# Patient Record
Sex: Male | Born: 1975 | Race: Asian | Hispanic: No | Marital: Married
Health system: Southern US, Community
[De-identification: ages and names within clinical notes are randomized; demographics above are authoritative.]

---

## 2016-09-28 DIAGNOSIS — Z0000 Encounter for general adult medical examination without abnormal findings: Secondary | ICD-10-CM | POA: Diagnosis not present

## 2016-09-28 DIAGNOSIS — Z13220 Encounter for screening for lipoid disorders: Secondary | ICD-10-CM | POA: Diagnosis not present

## 2017-11-04 DIAGNOSIS — Z0000 Encounter for general adult medical examination without abnormal findings: Secondary | ICD-10-CM | POA: Diagnosis not present

## 2017-11-04 DIAGNOSIS — M3501 Sicca syndrome with keratoconjunctivitis: Secondary | ICD-10-CM | POA: Diagnosis not present

## 2019-01-09 DIAGNOSIS — M3501 Sicca syndrome with keratoconjunctivitis: Secondary | ICD-10-CM | POA: Diagnosis not present

## 2019-01-09 DIAGNOSIS — Z0000 Encounter for general adult medical examination without abnormal findings: Secondary | ICD-10-CM | POA: Diagnosis not present

## 2019-01-09 DIAGNOSIS — E559 Vitamin D deficiency, unspecified: Secondary | ICD-10-CM | POA: Diagnosis not present

## 2019-01-12 DIAGNOSIS — Z0000 Encounter for general adult medical examination without abnormal findings: Secondary | ICD-10-CM | POA: Diagnosis not present

## 2019-06-07 DIAGNOSIS — R519 Headache, unspecified: Secondary | ICD-10-CM | POA: Diagnosis not present

## 2019-06-07 DIAGNOSIS — R410 Disorientation, unspecified: Secondary | ICD-10-CM | POA: Diagnosis not present

## 2019-06-15 DIAGNOSIS — H524 Presbyopia: Secondary | ICD-10-CM | POA: Diagnosis not present

## 2019-06-15 DIAGNOSIS — H5319 Other subjective visual disturbances: Secondary | ICD-10-CM | POA: Diagnosis not present

## 2019-06-15 DIAGNOSIS — H31092 Other chorioretinal scars, left eye: Secondary | ICD-10-CM | POA: Diagnosis not present

## 2019-07-05 ENCOUNTER — Other Ambulatory Visit: Payer: Self-pay | Admitting: Family Medicine

## 2019-07-05 DIAGNOSIS — R410 Disorientation, unspecified: Secondary | ICD-10-CM

## 2019-07-14 ENCOUNTER — Ambulatory Visit
Admission: RE | Admit: 2019-07-14 | Discharge: 2019-07-14 | Disposition: A | Discharge status: Home / Self Care | Payer: BC Managed Care – PPO | Source: Ambulatory Visit | Attending: Family Medicine | Admitting: Family Medicine

## 2019-07-14 ENCOUNTER — Other Ambulatory Visit: Payer: Self-pay

## 2019-07-14 DIAGNOSIS — R410 Disorientation, unspecified: Secondary | ICD-10-CM | POA: Diagnosis not present

## 2019-07-14 DIAGNOSIS — S0990XA Unspecified injury of head, initial encounter: Secondary | ICD-10-CM | POA: Diagnosis not present

## 2019-09-28 ENCOUNTER — Ambulatory Visit: Payer: BC Managed Care – PPO | Attending: Internal Medicine

## 2019-09-28 DIAGNOSIS — Z23 Encounter for immunization: Secondary | ICD-10-CM

## 2019-09-28 NOTE — Progress Notes (Signed)
   [facility]ination [facility]  Name:  Frank Murray    MRN: [id number] DOB: 10-05-1975  09/28/2019  Frank Murray was observed post Covid-19 immunization for 15 minutes without incident. He was provided with Vaccine Information Sheet and instruction to access the V-Safe system.   Frank Murray was instructed to call 911 with any severe reactions post vaccine: Marland Kitchen Difficulty breathing  . Swelling of face and throat  . A fast heartbeat  . A bad rash all over body  . Dizziness and weakness   Immunizations Administered    Name Date Dose VIS Date Route   Pfizer COVID-19 Vaccine 09/28/2019  4:17 PM 0.3 mL 05/26/2019 Intramuscular   Manufacturer: [employer], [employer]   Lot: [id number]   NDC: [address]-6

## 2019-10-23 ENCOUNTER — Ambulatory Visit: Payer: BC Managed Care – PPO | Attending: Internal Medicine

## 2019-10-23 DIAGNOSIS — Z23 Encounter for immunization: Secondary | ICD-10-CM

## 2019-10-23 NOTE — Progress Notes (Signed)
   Covid-[facility] Vaccination Clinic  Name:  Frank Murray    MRN: [id number] DOB: Oct 19, 1975  10/23/2019  [name] was observed post Covid-19 immunization for 15 minutes without incident. He was provided with Vaccine Information Sheet and instruction to access the V-Safe system.   [name] was instructed to call 911 with any severe reactions post vaccine: Marland Kitchen Difficulty breathing  . Swelling of face and throat  . A fast heartbeat  . A bad rash all over body  . Dizziness and weakness   Immunizations Administered    Name Date Dose VIS Date Route   Pfizer COVID-19 Vaccine 10/23/2019  4:19 PM 0.3 mL 08/09/2018 Intramuscular   Manufacturer: [employer], [employer]   Lot: [id number]   NDC: [address]-7

## 2020-01-18 DIAGNOSIS — Z0000 Encounter for general adult medical examination without abnormal findings: Secondary | ICD-10-CM | POA: Diagnosis not present

## 2020-01-26 DIAGNOSIS — E559 Vitamin D deficiency, unspecified: Secondary | ICD-10-CM | POA: Diagnosis not present

## 2020-01-26 DIAGNOSIS — Z0000 Encounter for general adult medical examination without abnormal findings: Secondary | ICD-10-CM | POA: Diagnosis not present

## 2020-01-26 DIAGNOSIS — M3501 Sicca syndrome with keratoconjunctivitis: Secondary | ICD-10-CM | POA: Diagnosis not present

## 2020-01-26 DIAGNOSIS — Z13220 Encounter for screening for lipoid disorders: Secondary | ICD-10-CM | POA: Diagnosis not present

## 2021-02-03 DIAGNOSIS — E559 Vitamin D deficiency, unspecified: Secondary | ICD-10-CM | POA: Diagnosis not present

## 2021-02-03 DIAGNOSIS — Z0000 Encounter for general adult medical examination without abnormal findings: Secondary | ICD-10-CM | POA: Diagnosis not present

## 2021-02-03 DIAGNOSIS — M3501 Sicca syndrome with keratoconjunctivitis: Secondary | ICD-10-CM | POA: Diagnosis not present

## 2021-02-03 DIAGNOSIS — Z13220 Encounter for screening for lipoid disorders: Secondary | ICD-10-CM | POA: Diagnosis not present

## 2021-03-06 DIAGNOSIS — S86812A Strain of other muscle(s) and tendon(s) at lower leg level, left leg, initial encounter: Secondary | ICD-10-CM | POA: Diagnosis not present

## 2021-03-25 IMAGING — MR MR HEAD W/O CM
11 series · 48 of 48 positions shown · non-contrast
Comparison: None.

CLINICAL DATA: History of head injury April 2019 and episode of
disorientation May 2019

EXAM:
MRI HEAD WITHOUT CONTRAST
TECHNIQUE: Multiplanar, multiecho pulse sequences of the brain and surrounding
structures were obtained without intravenous contrast.

[Series 2: T1 · sagittal · 5.0mm · 0.45mm/px · 3 of 21 slices shown]
[im 1/21]
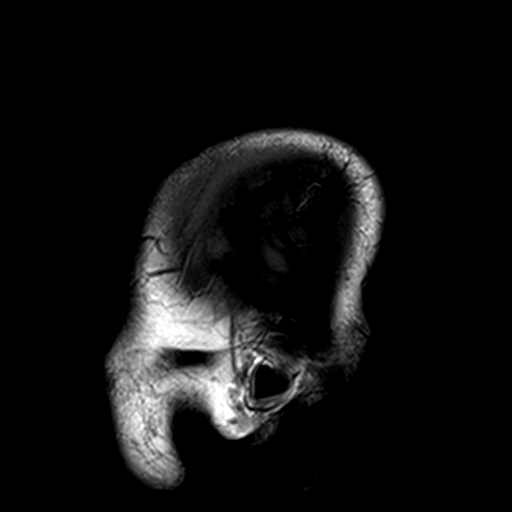
[im 11/21]
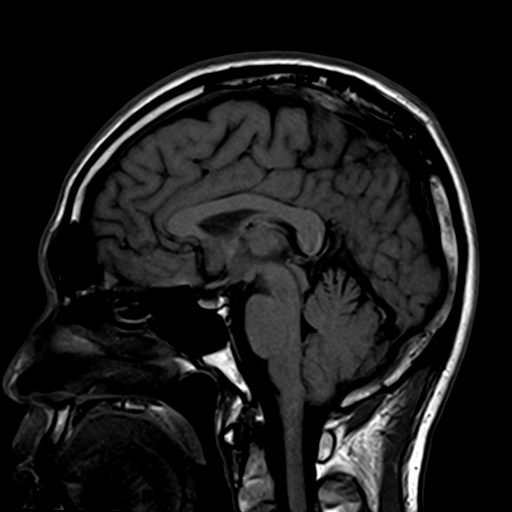
[im 21/21]
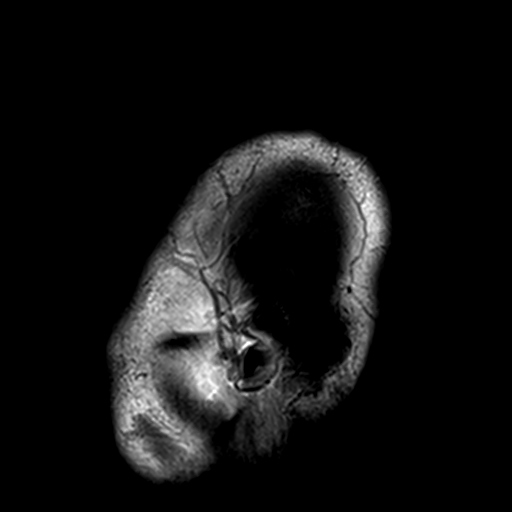

[Series 3: DWI · axial · 3.0mm · 1.80mm/px · z∈[-46,+107]mm · 9 of 104 slices shown (1 of 4)]
[im 1/104]
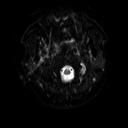
[im 13/104]
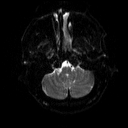
[im 26/104]
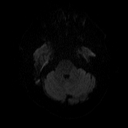
[im 39/104]
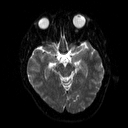
[im 52/104]
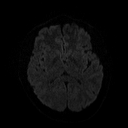
[im 65/104]
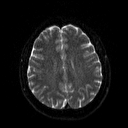
[im 78/104]
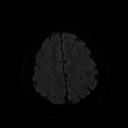
[im 91/104]
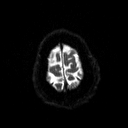
[im 104/104]
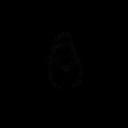

[Series 4: DWI · axial · 3.0mm · 1.80mm/px · z∈[-46,+107]mm · 4 of 50 slices shown (2 of 4)]
[im 1/50]
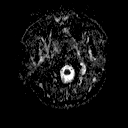
[im 17/50]
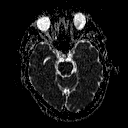
[im 33/50]
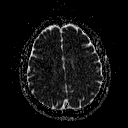
[im 50/50]
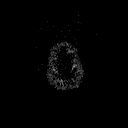

[Series 5: DWI · coronal · 5.0mm · 1.80mm/px · 5 of 64 slices shown (3 of 4)]
[im 1/64]
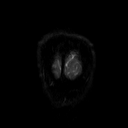
[im 16/64]
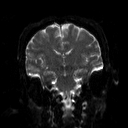
[im 32/64]
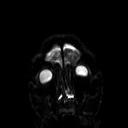
[im 48/64]
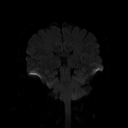
[im 64/64]
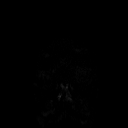

[Series 6: DWI · coronal · 5.0mm · 1.80mm/px · 3 of 34 slices shown (4 of 4)]
[im 1/34]
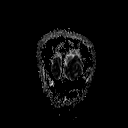
[im 17/34]
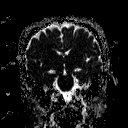
[im 34/34]
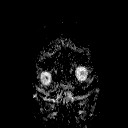

[Series 7: T2 · axial · 5.0mm · 0.60mm/px · z∈[-49,+112]mm · 2 of 24 slices shown (1 of 2)]
[im 1/24]
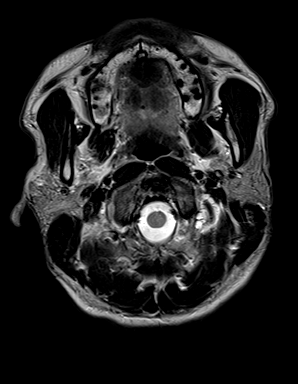
[im 24/24]
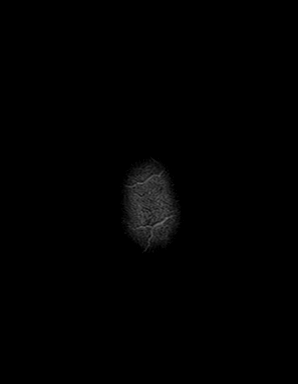

[Series 8: FLAIR · axial · 3.0mm · 0.45mm/px · z∈[-44,+105]mm · 3 of 33 slices shown (1 of 2)]
[im 1/33]
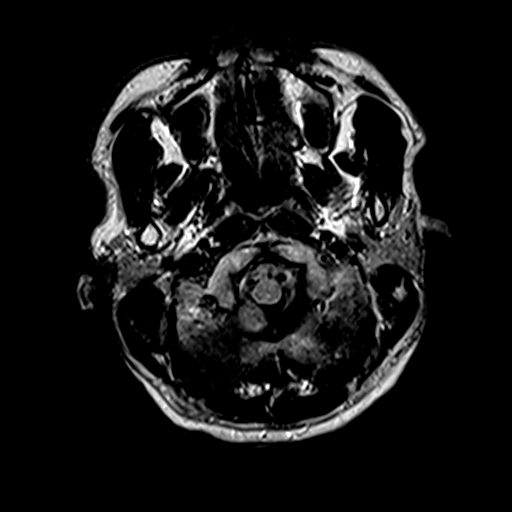
[im 17/33]
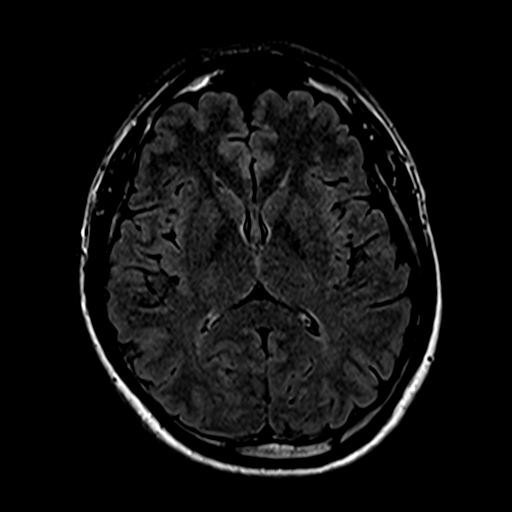
[im 33/33]
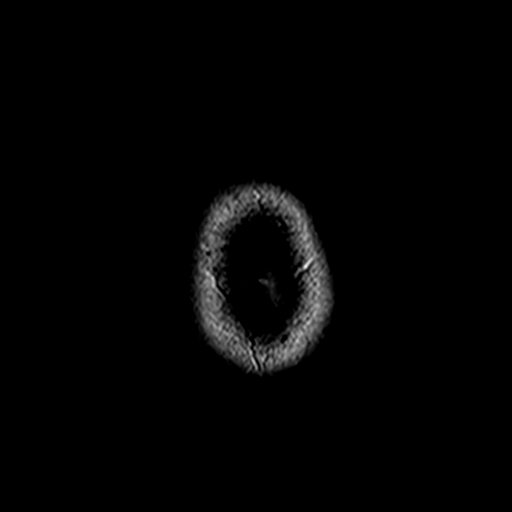

[Series 10: swi_images · axial · 4.0mm · 0.90mm/px · z∈[-52,+104]mm · 3 of 40 slices shown]
[im 1/40]
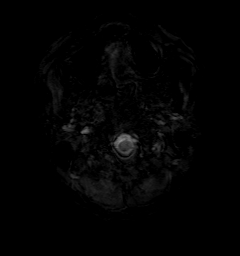
[im 20/40]
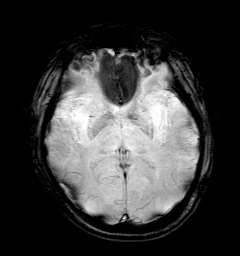
[im 40/40]
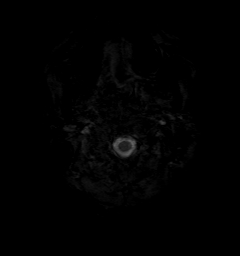

[Series 11: t1_mpr_tra · axial · 1.0mm · 0.71mm/px · z∈[-40,+103]mm · 12 of 144 slices shown]
[im 1/144]
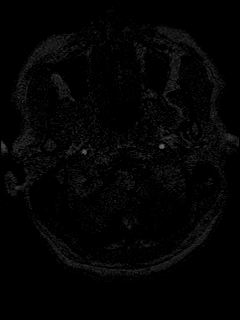
[im 14/144]
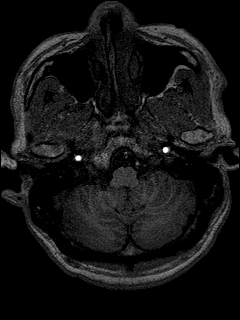
[im 27/144]
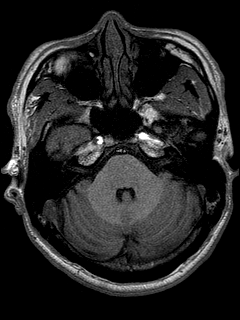
[im 40/144]
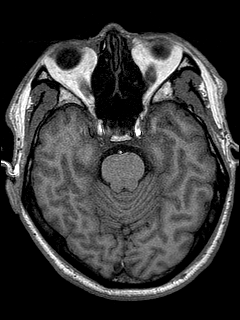
[im 53/144]
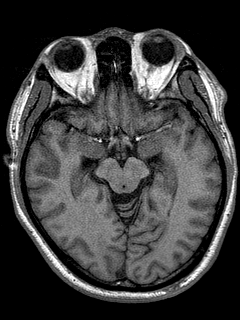
[im 66/144]
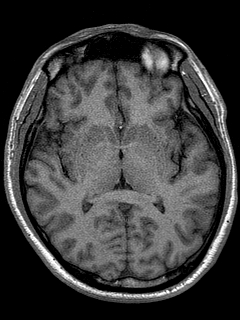
[im 79/144]
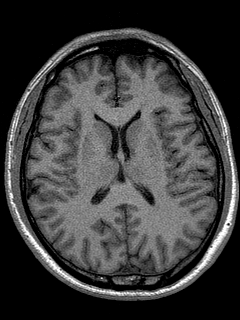
[im 92/144]
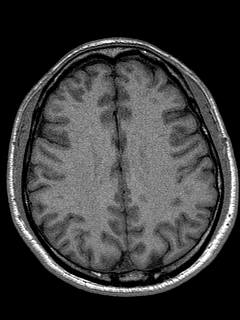
[im 105/144]
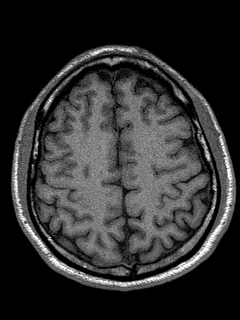
[im 118/144]
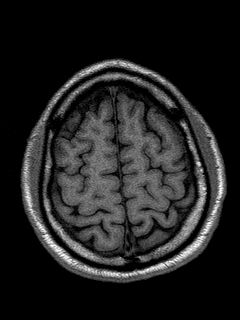
[im 131/144]
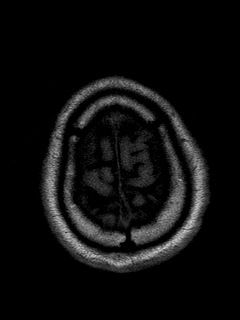
[im 144/144]
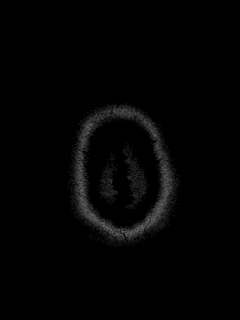

[Series 12: T2 · coronal · 5.0mm · 0.45mm/px · 2 of 25 slices shown (2 of 2)]
[im 1/25]
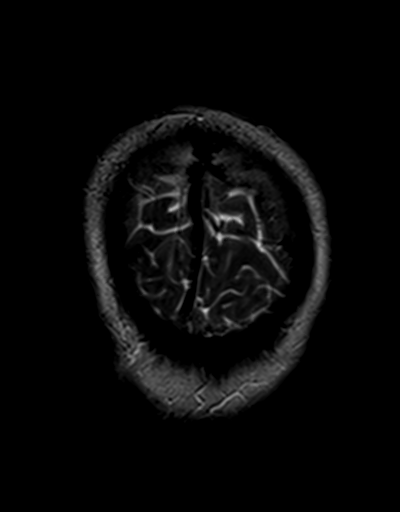
[im 25/25]
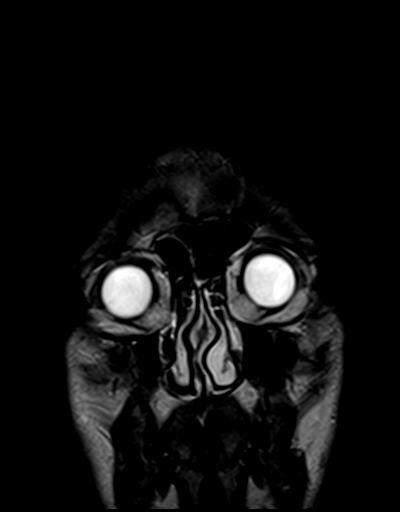

[Series 13: FLAIR · sagittal · 5.0mm · 0.45mm/px · 2 of 25 slices shown (2 of 2)]
[im 1/25]
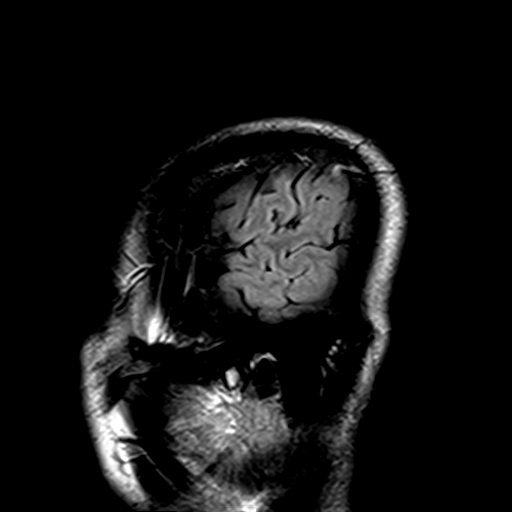
[im 25/25]
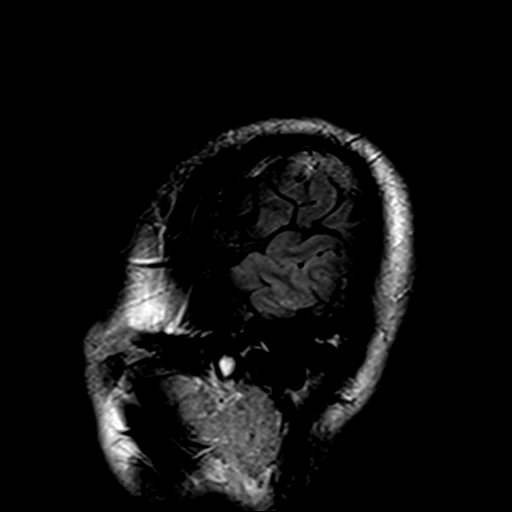

[48 of 48 positions shown; findings below may reference images not displayed]

FINDINGS: Brain: No acute infarction, hemorrhage, hydrocephalus, extra-axial
collection or mass lesion. No chronic blood products or
posttraumatic gliosis. No white matter disease or volume loss.

Vascular: Normal flow voids.

Skull and upper cervical spine: Normal marrow signal.

Sinuses/Orbits: Normal
IMPRESSION: Normal brain MRI.

## 2021-05-29 DIAGNOSIS — M5386 Other specified dorsopathies, lumbar region: Secondary | ICD-10-CM | POA: Diagnosis not present

## 2021-05-29 DIAGNOSIS — M5137 Other intervertebral disc degeneration, lumbosacral region: Secondary | ICD-10-CM | POA: Diagnosis not present

## 2021-05-29 DIAGNOSIS — M9903 Segmental and somatic dysfunction of lumbar region: Secondary | ICD-10-CM | POA: Diagnosis not present

## 2021-05-29 DIAGNOSIS — M9904 Segmental and somatic dysfunction of sacral region: Secondary | ICD-10-CM | POA: Diagnosis not present

## 2021-06-19 DIAGNOSIS — M9903 Segmental and somatic dysfunction of lumbar region: Secondary | ICD-10-CM | POA: Diagnosis not present

## 2021-06-19 DIAGNOSIS — M5137 Other intervertebral disc degeneration, lumbosacral region: Secondary | ICD-10-CM | POA: Diagnosis not present

## 2021-06-19 DIAGNOSIS — M9904 Segmental and somatic dysfunction of sacral region: Secondary | ICD-10-CM | POA: Diagnosis not present

## 2021-06-19 DIAGNOSIS — M5386 Other specified dorsopathies, lumbar region: Secondary | ICD-10-CM | POA: Diagnosis not present

## 2021-10-30 DIAGNOSIS — K648 Other hemorrhoids: Secondary | ICD-10-CM | POA: Diagnosis not present

## 2021-10-30 DIAGNOSIS — D120 Benign neoplasm of cecum: Secondary | ICD-10-CM | POA: Diagnosis not present

## 2021-10-30 DIAGNOSIS — Z1211 Encounter for screening for malignant neoplasm of colon: Secondary | ICD-10-CM | POA: Diagnosis not present

## 2022-03-13 DIAGNOSIS — E559 Vitamin D deficiency, unspecified: Secondary | ICD-10-CM | POA: Diagnosis not present

## 2022-03-13 DIAGNOSIS — Z0000 Encounter for general adult medical examination without abnormal findings: Secondary | ICD-10-CM | POA: Diagnosis not present

## 2022-03-13 DIAGNOSIS — Z13220 Encounter for screening for lipoid disorders: Secondary | ICD-10-CM | POA: Diagnosis not present

## 2023-03-26 DIAGNOSIS — M3501 Sicca syndrome with keratoconjunctivitis: Secondary | ICD-10-CM | POA: Diagnosis not present

## 2023-03-26 DIAGNOSIS — Z13220 Encounter for screening for lipoid disorders: Secondary | ICD-10-CM | POA: Diagnosis not present

## 2023-03-26 DIAGNOSIS — Z0000 Encounter for general adult medical examination without abnormal findings: Secondary | ICD-10-CM | POA: Diagnosis not present

## 2023-03-26 DIAGNOSIS — E559 Vitamin D deficiency, unspecified: Secondary | ICD-10-CM | POA: Diagnosis not present

## 2023-03-26 DIAGNOSIS — Z23 Encounter for immunization: Secondary | ICD-10-CM | POA: Diagnosis not present

## 2023-04-18 DIAGNOSIS — Z6826 Body mass index (BMI) 26.0-26.9, adult: Secondary | ICD-10-CM | POA: Diagnosis not present

## 2023-04-18 DIAGNOSIS — H10023 Other mucopurulent conjunctivitis, bilateral: Secondary | ICD-10-CM | POA: Diagnosis not present
